# Patient Record
Sex: Female | Born: 1975 | Race: White | Hispanic: No | Marital: Married
Health system: Southern US, Community
[De-identification: ages and names within clinical notes are randomized; demographics above are authoritative.]

---

## 2009-05-07 ENCOUNTER — Ambulatory Visit: Payer: Self-pay | Admitting: General Practice

## 2011-11-30 ENCOUNTER — Emergency Department: Payer: Self-pay | Admitting: Emergency Medicine

## 2012-10-03 IMAGING — CT CT HEAD WITHOUT CONTRAST
1 series · 16 of 29 positions shown, 20 images · non-contrast
Comparison: none

REASON FOR EXAM: head trauma, loc
COMMENTS:

PROCEDURE:     CT  - CT HEAD WITHOUT CONTRAST  - November 30, 2011  [DATE]
RESULT:     Technique: Helical 5mm sections were obtained from the skull
base to the vertex without administration of intravenous contrast.

[Series 2: soft tissue · axial · 0.41mm/px · z∈[-174,-44]mm · 16 of 29 slices shown, 20 images]
[im 2/29  brain]
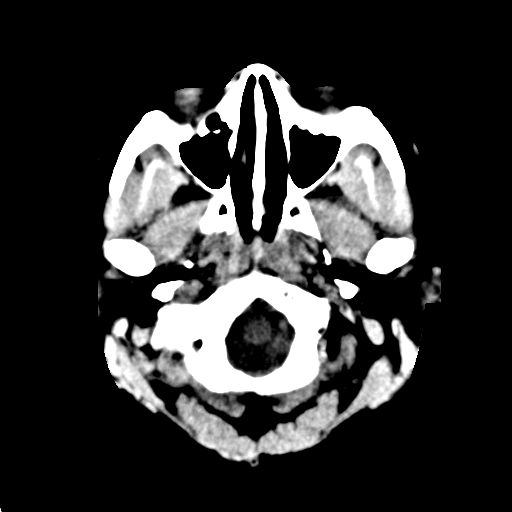
[im 2/29  bone]
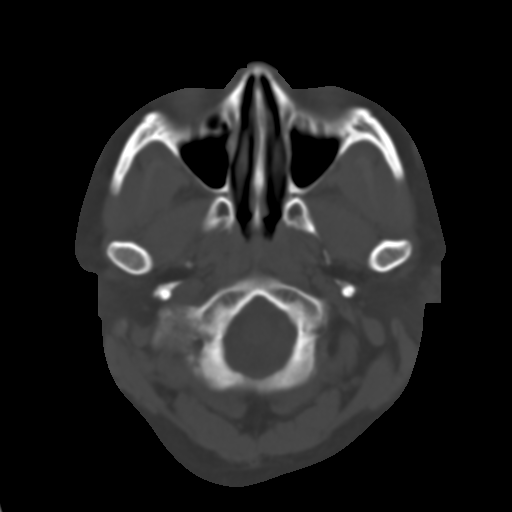
[im 4/29  brain]
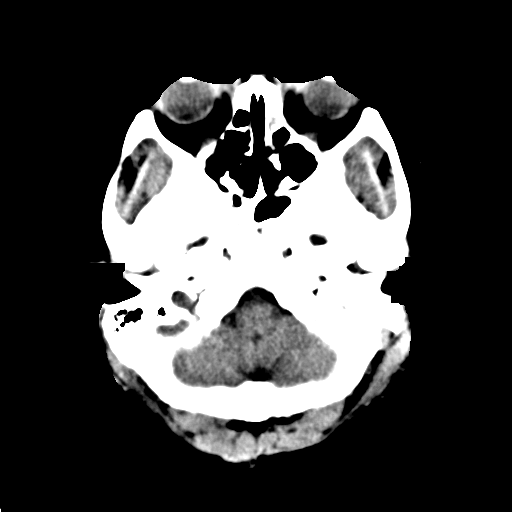
[im 6/29  brain]
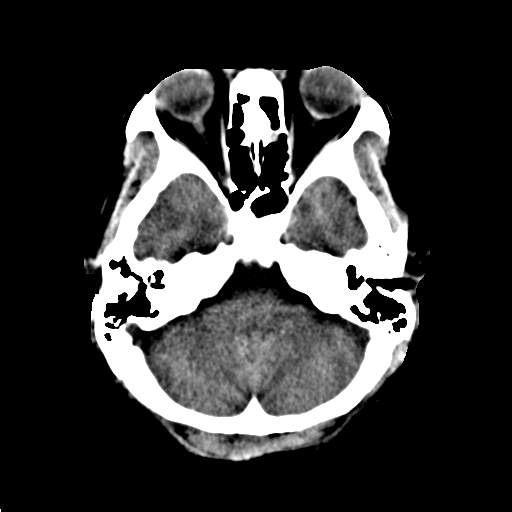
[im 7/29  brain]
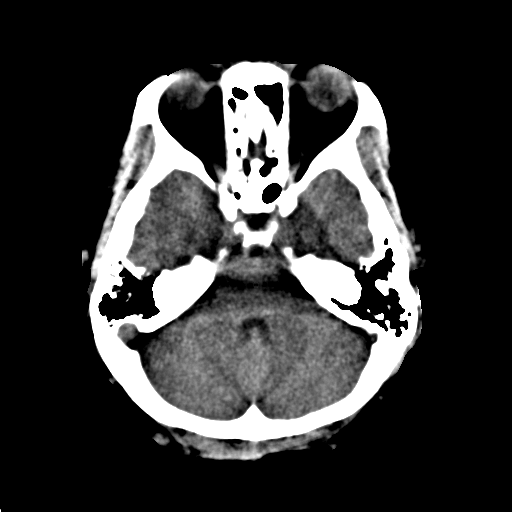
[im 9/29  brain]
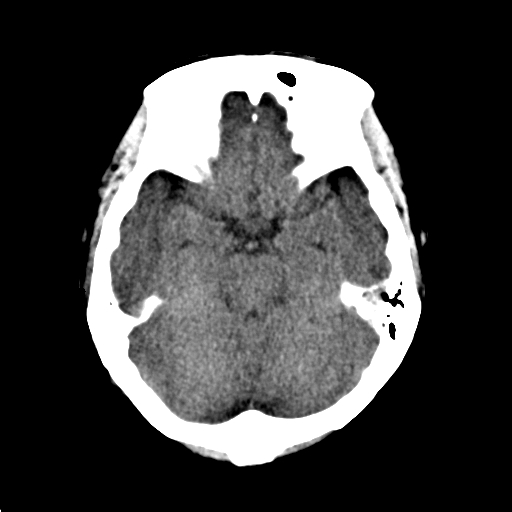
[im 9/29  bone]
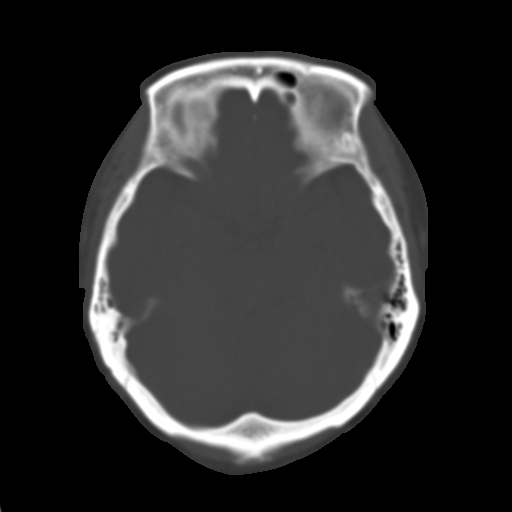
[im 11/29  brain]
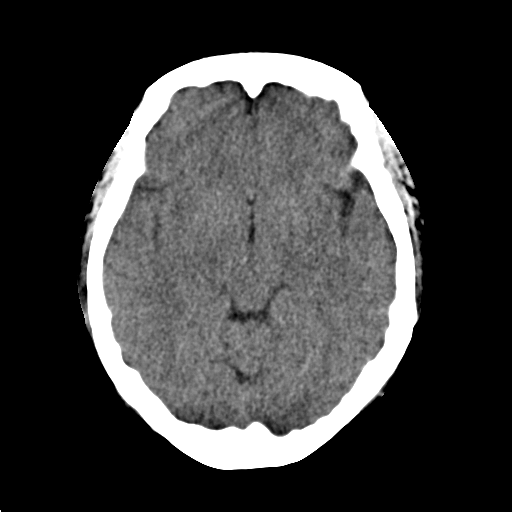
[im 12/29  brain]
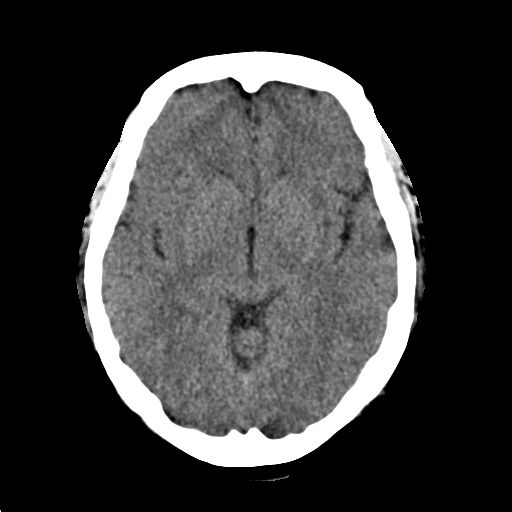
[im 14/29  brain]
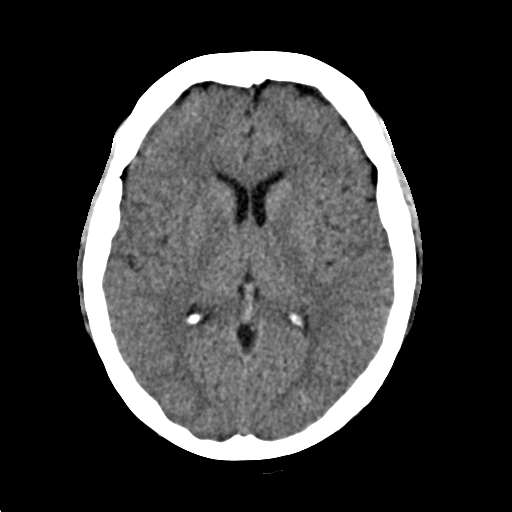
[im 16/29  brain]
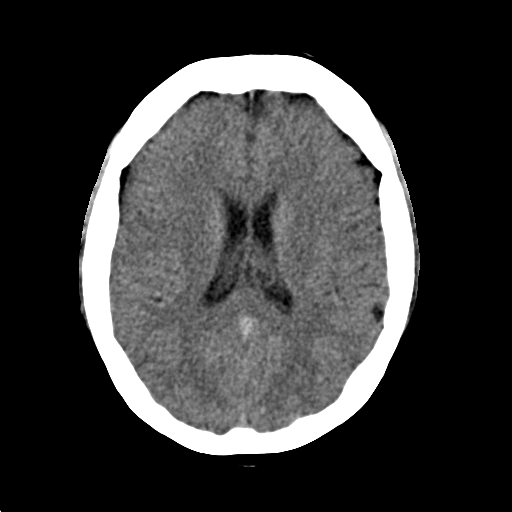
[im 16/29  bone]
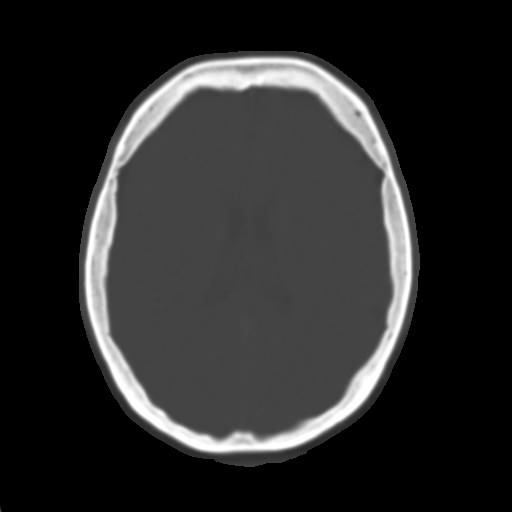
[im 18/29  brain]
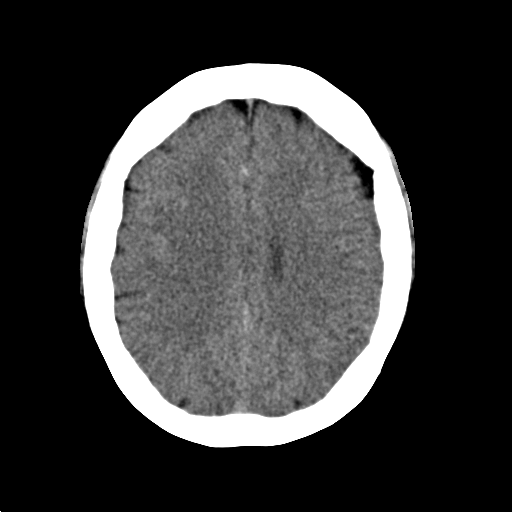
[im 19/29  brain]
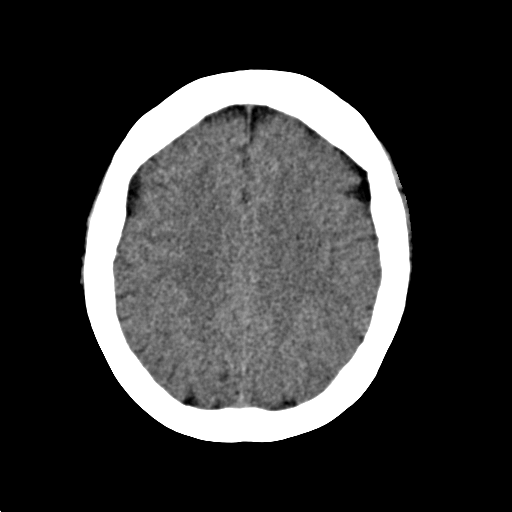
[im 21/29  brain]
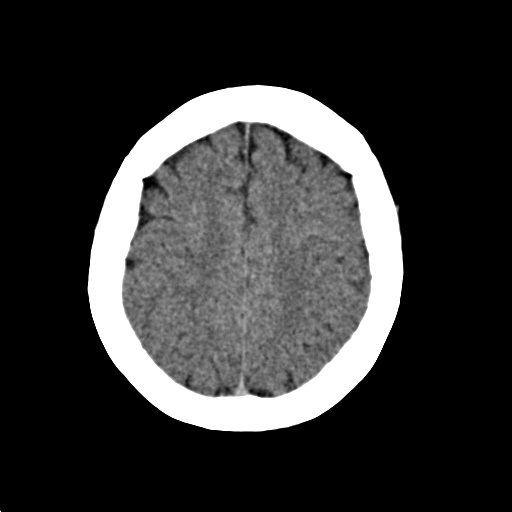
[im 23/29  brain]
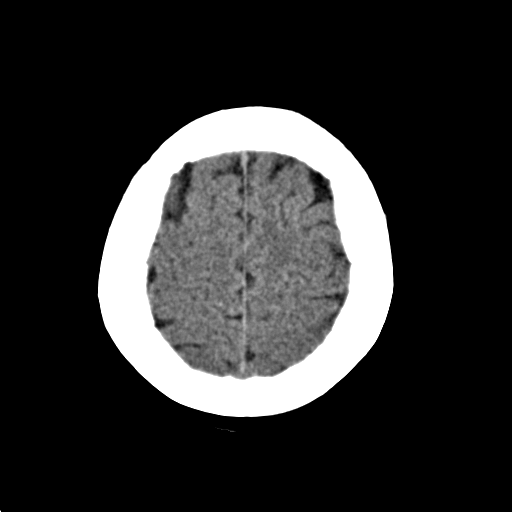
[im 23/29  bone]
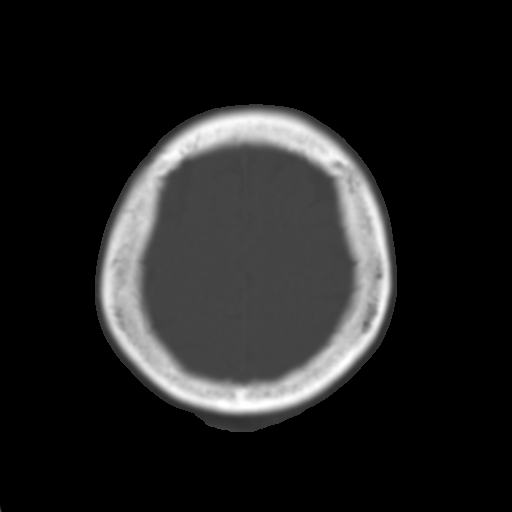
[im 24/29  brain]
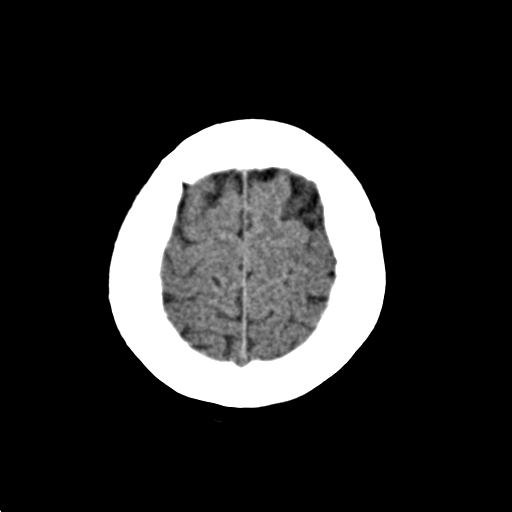
[im 26/29  brain]
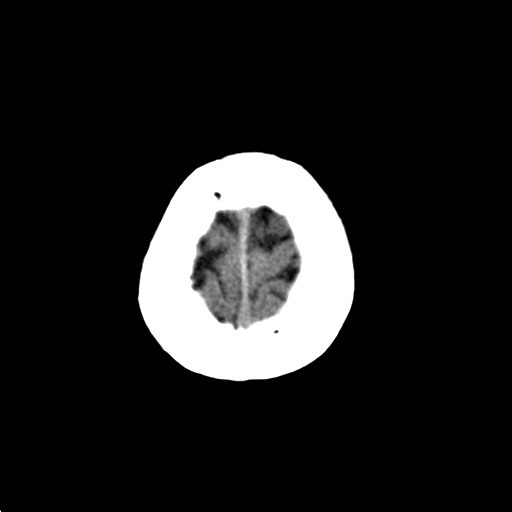
[im 28/29  brain]
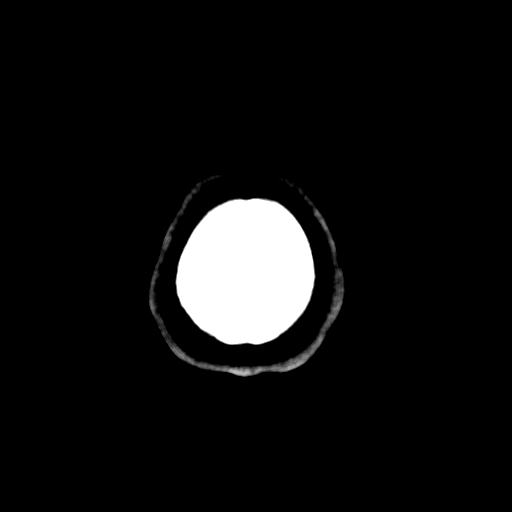

[16 of 29 positions shown; findings below may reference images not displayed]

FINDINGS: There is not evidence of intra-axial fluid collections. There is
no evidence of acute hemorrhage or secondary signs reflecting mass effect or
subacute or chronic focal territorial infarction. The osseous structures
demonstrate no evidence of a depressed skull fracture. If there is
persistent concern clinical follow-up with MRI is recommended.
IMPRESSION: 1. No evidence of acute intracranial abnormalitites.

## 2014-03-03 ENCOUNTER — Ambulatory Visit: Payer: Self-pay | Admitting: Emergency Medicine

## 2022-03-28 ENCOUNTER — Emergency Department
Admission: EM | Admit: 2022-03-28 | Discharge: 2022-03-28 | Disposition: A | Discharge status: Home / Self Care | Payer: PRIVATE HEALTH INSURANCE | Attending: Emergency Medicine | Admitting: Emergency Medicine

## 2022-03-28 ENCOUNTER — Emergency Department: Payer: PRIVATE HEALTH INSURANCE

## 2022-03-28 DIAGNOSIS — R109 Unspecified abdominal pain: Secondary | ICD-10-CM | POA: Diagnosis present

## 2022-03-28 LAB — URINALYSIS, ROUTINE W REFLEX MICROSCOPIC
Bacteria, UA: NONE SEEN
Bilirubin Urine: NEGATIVE
Glucose, UA: 50 mg/dL — AB
Ketones, ur: NEGATIVE mg/dL
Leukocytes,Ua: NEGATIVE
Nitrite: NEGATIVE
Protein, ur: NEGATIVE mg/dL
Specific Gravity, Urine: 1.006 (ref 1.005–1.030)
pH: 5 (ref 5.0–8.0)

## 2022-03-28 NOTE — ED Provider Notes (Signed)
   [facility] Provider Note    Event Date/Time   First MD Initiated Contact with Patient 03/28/22 2034     (approximate)   History   Left flank pain   HPI  Monica Rivas is a 46 y.o. female who presents to the emergency department today because of concerns for left flank pain that started yesterday.  Patient also noticed some urgency.  Patient denies history of kidney stones.      Physical Exam    General: Awake, alert, oriented. Resp:  Normal effort.  Other:  Moving all extremities.    ED Results / Procedures / Treatments   Labs (all labs ordered are listed, but only abnormal results are displayed) Labs Reviewed  URINALYSIS, ROUTINE W REFLEX MICROSCOPIC - Abnormal; Notable for the following components:      Result Value   Color, Urine STRAW (*)    APPearance CLEAR (*)    Glucose, UA 50 (*)    Hgb urine dipstick LARGE (*)    All other components within normal limits   EKG  None   RADIOLOGY I independently interpreted and visualized the CT renal. My interpretation: Left ureter enlarged Radiology interpretation:  IMPRESSION:  1. Mild fullness of the LEFT ureter all the way to the LEFT  ureterovesical junction. No visible calculus. Mild perinephric  stranding LEFT greater than RIGHT. Findings may represent changes of  recently passed stone or urinary tract infection. Would correlate  with urinalysis and consider urology follow-up with nonemergent  hematuria evaluation as warranted on follow-up given asymmetry of  the ureter. Would also correlate with any symptoms that would  support the possibility of recent stone passage.  2. Marked hepatic steatosis and hepatomegaly. Fissural widening of  hepatic fissures, raising the question of liver disease.     PROCEDURES:  Critical Care performed: No  Procedures   MEDICATIONS ORDERED IN ED: Medications - No data to display   IMPRESSION / MDM / ASSESSMENT AND PLAN / ED COURSE  I  reviewed the triage vital signs and the nursing notes.                              Differential diagnosis includes, but is not limited to, kidney stone, UTI, pyelonephritis  Patient's presentation is most consistent with acute presentation with potential threat to life or bodily function.  Patient presented to the emergency department today for left sided flank pain. Urine with blood. Given concern for kidney stone CT renal was ordered. While this did not show a kidney stone it did have findings suggestive of recently passed stone. Discussed this with the patient. Additionally CT scan showed possible hepatic disease, patient was made aware of this. Given reassuring imaging findings will plan on discharge home.   FINAL CLINICAL IMPRESSION(S) / ED DIAGNOSES   Final diagnoses:  Left flank pain      Note:  This document was prepared using Dragon voice recognition software and may include unintentional dictation errors.    Phineas Semen, MD 03/28/22 2252

## 2022-03-28 NOTE — ED Notes (Signed)
Patient transported to CT 

## 2022-03-28 NOTE — ED Triage Notes (Signed)
Pt c/o left flank pain x1 day with urinary frequency and hesitancy with no hematuria. Pt denies N/V.

## 2022-03-28 NOTE — Discharge Instructions (Signed)
Please be sure to follow up with your primary care doctor. Please do not hesitate to have fun on your vacation. Please be sure to "hydrate" whilst the rest of your family is engaged in [place] activities.

## 2023-01-30 IMAGING — CT CT RENAL STONE PROTOCOL
2 of 4 series · 15 of 46 positions shown, 17 images · non-contrast
Comparison: None available

CLINICAL DATA: A 46-year-old female presents with LEFT-sided flank
pain for 2 days.



[Series 2: stone full standard · axial · 0.79mm/px · z∈[-642,-202]mm · 12 of 97 slices shown, 14 images]
[im 5/97  soft-tissue]
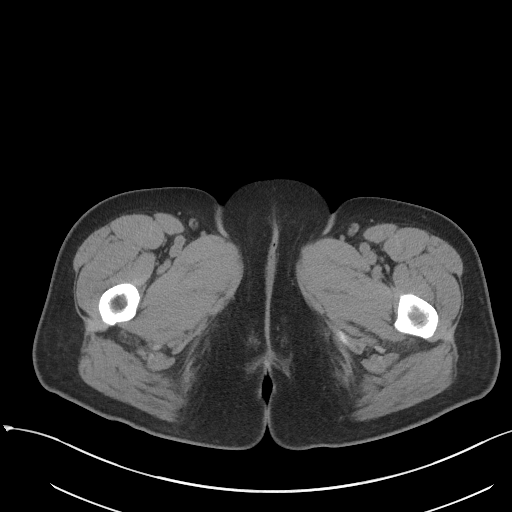
[im 5/97  bone]
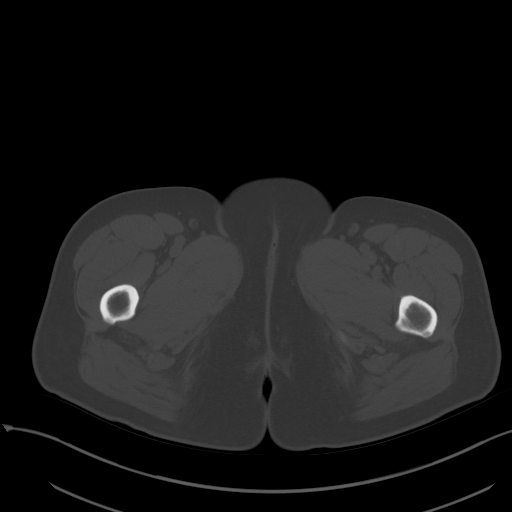
[im 13/97  soft-tissue]
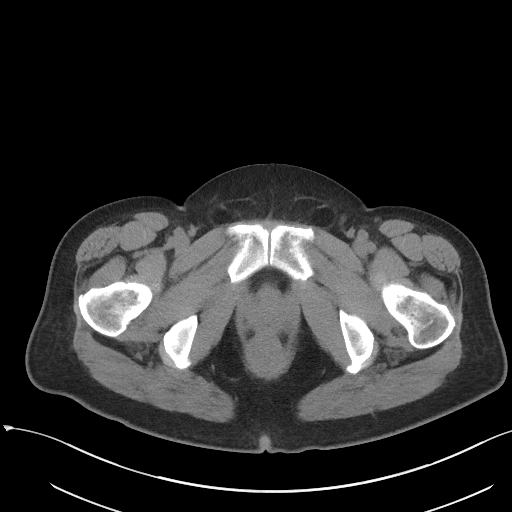
[im 21/97  soft-tissue]
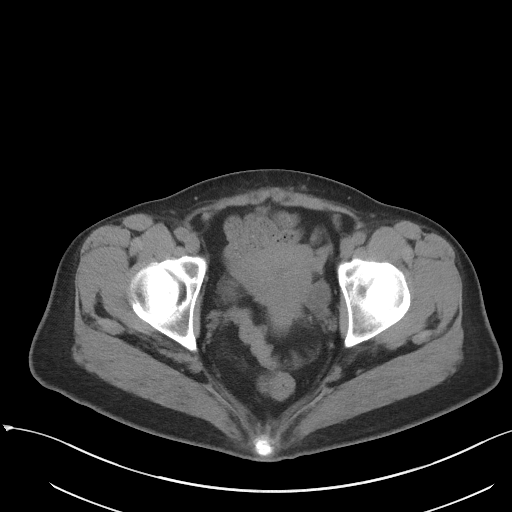
[im 29/97  soft-tissue]
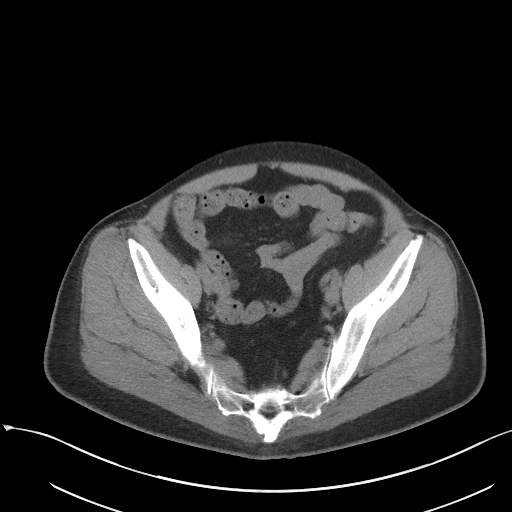
[im 37/97  soft-tissue]
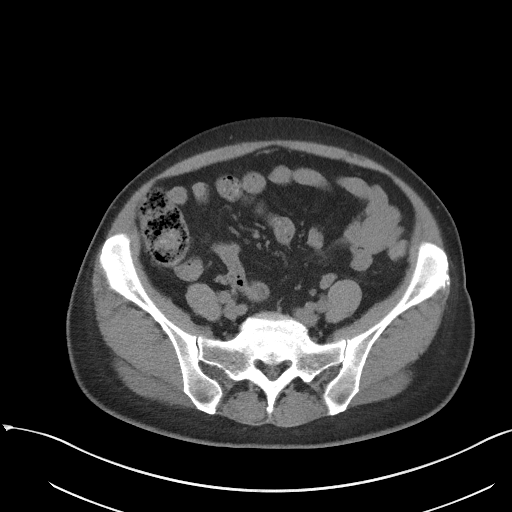
[im 45/97  soft-tissue]
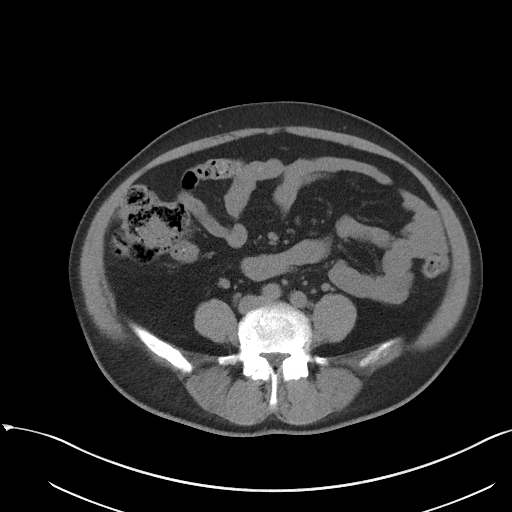
[im 53/97  soft-tissue]
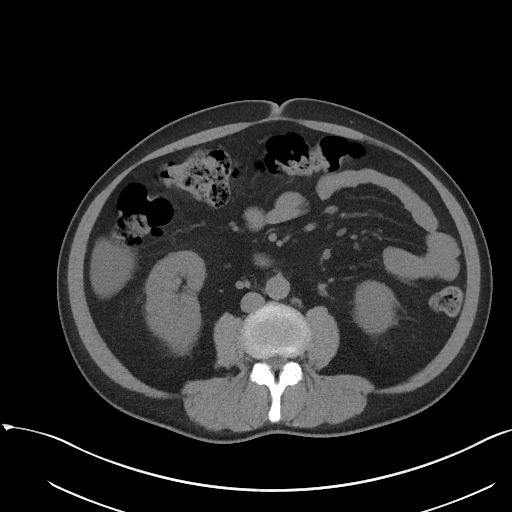
[im 61/97  soft-tissue]
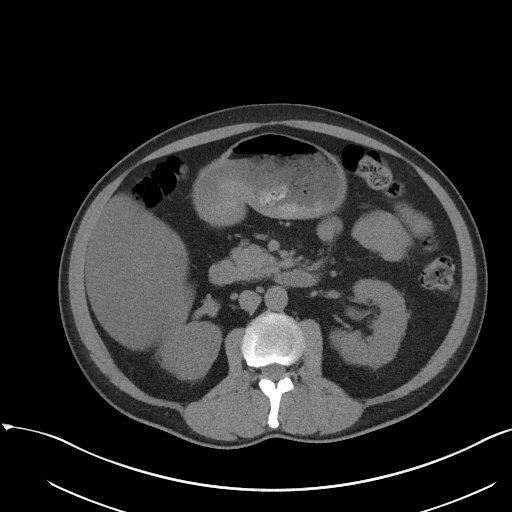
[im 69/97  soft-tissue]
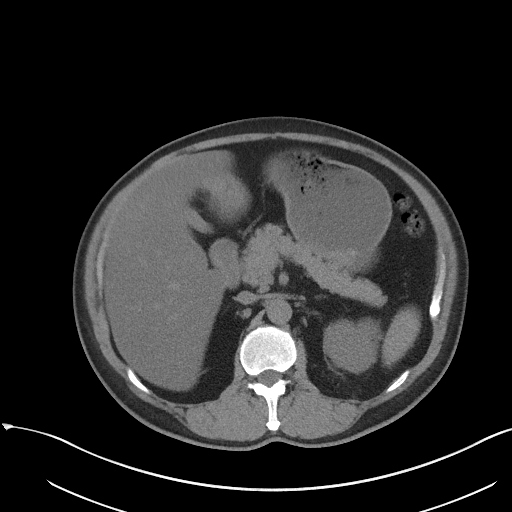
[im 69/97  bone]
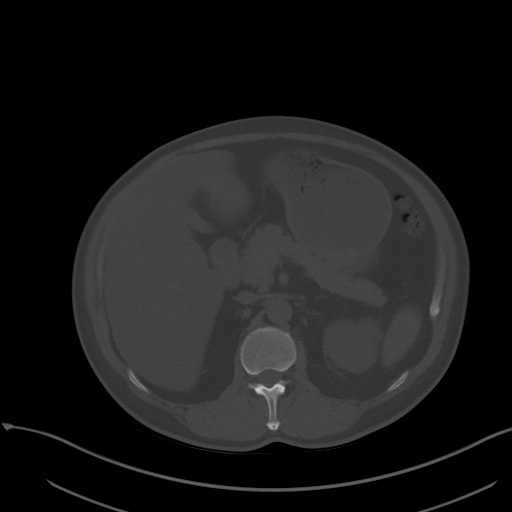
[im 77/97  soft-tissue]
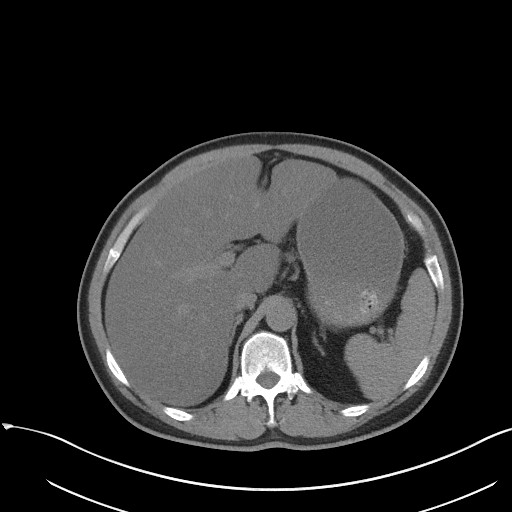
[im 85/97  soft-tissue]
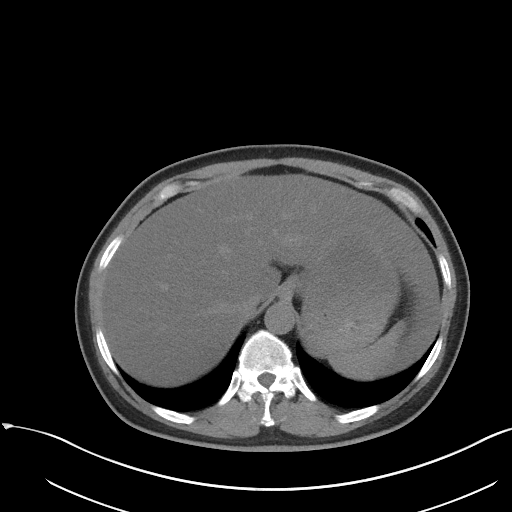
[im 93/97  soft-tissue]
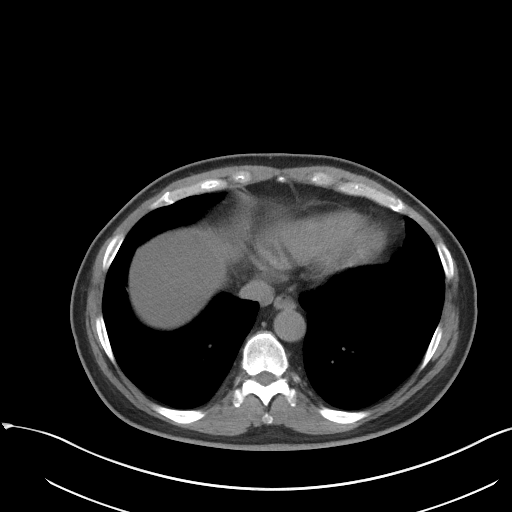

[Series 5: coronal · coronal · 0.75mm/px · 3 of 140 slices shown]
[im 47/140  soft-tissue]
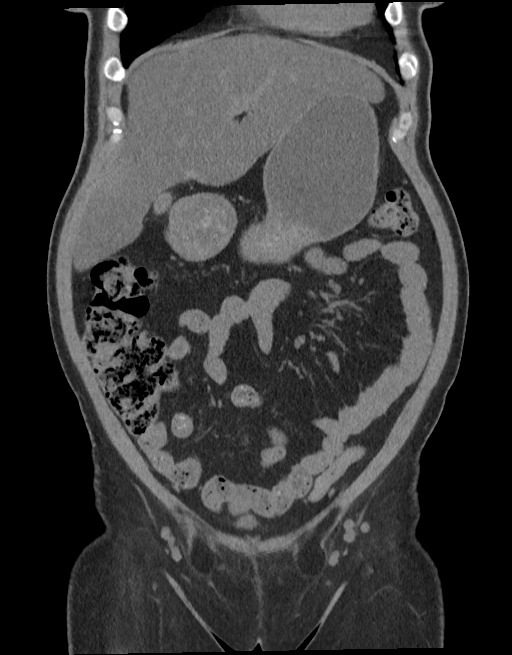
[im 62/140  soft-tissue]
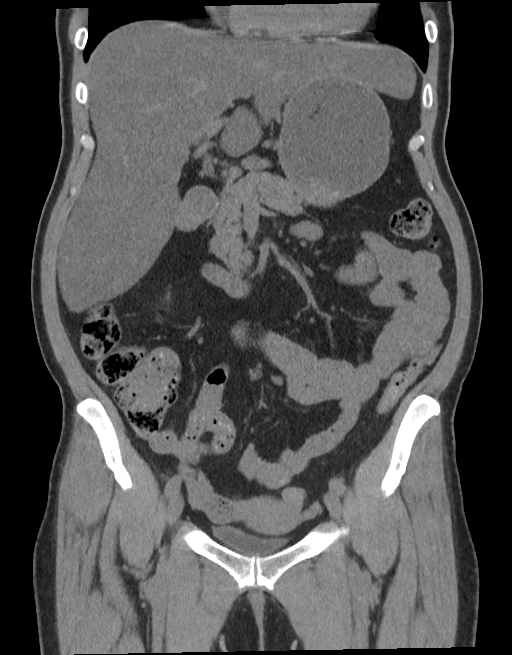
[im 78/140  soft-tissue]
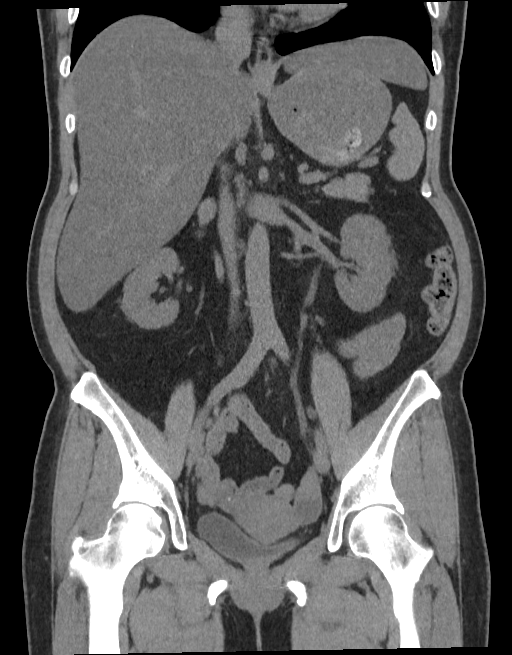

[15 of 46 positions shown; findings below may reference images not displayed]

FINDINGS: Lower chest: Lung bases are clear without effusion or consolidation.

Hepatobiliary: Marked hepatic steatosis. No pericholecystic
stranding or biliary duct dilation. Fissural widening of hepatic
fissures. Moderate hepatomegaly.

Pancreas: Normal contour without signs of inflammation.

Spleen: Normal.

Adrenals/Urinary Tract: Adrenal glands are normal.

Mild fullness of the LEFT ureter all the way to the LEFT
ureterovesical junction. Mild Peri ureteral stranding. No visible
calculus.

Mild perinephric stranding LEFT greater than RIGHT. Urinary bladder
is under distended limiting assessment.

Stomach/Bowel: Stool filled distal small bowel. Normal appendix.
Moderate stool in the colon in the proximal colon with collapse of
the distal colon.

Vascular/Lymphatic:

Aorta with smooth contours. IVC with smooth contours. No aneurysmal
dilation of the abdominal aorta. There is no gastrohepatic or
hepatoduodenal ligament lymphadenopathy. No retroperitoneal or
mesenteric lymphadenopathy.

No pelvic sidewall lymphadenopathy.

Limited assessment of vascular structures without contrast.

Reproductive: Unremarkable by CT.

Other: No ascites.  No free air.

Musculoskeletal: No acute bone finding. No destructive bone process.
Spinal degenerative changes.
IMPRESSION: 1. Mild fullness of the LEFT ureter all the way to the LEFT
ureterovesical junction. No visible calculus. Mild perinephric
stranding LEFT greater than RIGHT. Findings may represent changes of
recently passed stone or urinary tract infection. Would correlate
with urinalysis and consider urology follow-up with nonemergent
hematuria evaluation as warranted on follow-up given asymmetry of
the ureter. Would also correlate with any symptoms that would
support the possibility of recent stone passage.
2. Marked hepatic steatosis and hepatomegaly. Fissural widening of
hepatic fissures, raising the question of liver disease.
